# Patient Record
Sex: Male | Born: 1975 | Race: White | Hispanic: No | Marital: Single | Smoking: Former smoker
Health system: Southern US, Community
[De-identification: ages and names within clinical notes are randomized; demographics above are authoritative.]

## PROBLEM LIST (undated history)

## (undated) DIAGNOSIS — M1990 Unspecified osteoarthritis, unspecified site: Secondary | ICD-10-CM

## (undated) DIAGNOSIS — R059 Cough, unspecified: Secondary | ICD-10-CM

## (undated) DIAGNOSIS — K069 Disorder of gingiva and edentulous alveolar ridge, unspecified: Secondary | ICD-10-CM

## (undated) DIAGNOSIS — Z972 Presence of dental prosthetic device (complete) (partial): Secondary | ICD-10-CM

## (undated) DIAGNOSIS — R55 Syncope and collapse: Secondary | ICD-10-CM

## (undated) DIAGNOSIS — R05 Cough: Secondary | ICD-10-CM

## (undated) DIAGNOSIS — H6690 Otitis media, unspecified, unspecified ear: Secondary | ICD-10-CM

## (undated) HISTORY — DX: Disorder of gingiva and edentulous alveolar ridge, unspecified: K06.9

## (undated) HISTORY — DX: Unspecified osteoarthritis, unspecified site: M19.90

## (undated) HISTORY — DX: Cough, unspecified: R05.9

## (undated) HISTORY — DX: Syncope and collapse: R55

## (undated) HISTORY — DX: Cough: R05

## (undated) HISTORY — DX: Otitis media, unspecified, unspecified ear: H66.90

## (undated) HISTORY — PX: MULTIPLE TOOTH EXTRACTIONS: SHX2053

## (undated) HISTORY — PX: INNER EAR SURGERY: SHX679

## (undated) HISTORY — DX: Presence of dental prosthetic device (complete) (partial): Z97.2

---

## 2009-08-05 ENCOUNTER — Ambulatory Visit (HOSPITAL_COMMUNITY): Admission: RE | Admit: 2009-08-05 | Discharge: 2009-08-05 | Payer: Self-pay | Admitting: Internal Medicine

## 2009-08-05 ENCOUNTER — Ambulatory Visit: Payer: Self-pay | Admitting: Internal Medicine

## 2009-08-05 DIAGNOSIS — R05 Cough: Secondary | ICD-10-CM

## 2009-08-05 DIAGNOSIS — K4090 Unilateral inguinal hernia, without obstruction or gangrene, not specified as recurrent: Secondary | ICD-10-CM | POA: Insufficient documentation

## 2009-08-05 DIAGNOSIS — F341 Dysthymic disorder: Secondary | ICD-10-CM

## 2009-08-05 DIAGNOSIS — K029 Dental caries, unspecified: Secondary | ICD-10-CM | POA: Insufficient documentation

## 2009-09-05 ENCOUNTER — Ambulatory Visit: Payer: Self-pay | Admitting: Infectious Diseases

## 2009-10-17 ENCOUNTER — Ambulatory Visit: Payer: Self-pay | Admitting: Internal Medicine

## 2010-02-15 ENCOUNTER — Ambulatory Visit: Payer: Self-pay | Admitting: Internal Medicine
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
## 2011-02-20 ENCOUNTER — Encounter: Payer: Self-pay | Admitting: Internal Medicine

## 2011-05-23 ENCOUNTER — Other Ambulatory Visit: Payer: Self-pay | Admitting: *Deleted

## 2011-05-23 NOTE — Telephone Encounter (Signed)
Pt is making an appt today

## 2011-05-24 MED ORDER — AMITRIPTYLINE HCL 25 MG PO TABS: 25.0000 mg | ORAL_TABLET | Freq: Every day | ORAL | Status: DC

## 2011-07-23 ENCOUNTER — Ambulatory Visit (INDEPENDENT_AMBULATORY_CARE_PROVIDER_SITE_OTHER): Payer: Self-pay | Admitting: Internal Medicine

## 2011-07-23 DIAGNOSIS — R059 Cough, unspecified: Secondary | ICD-10-CM

## 2011-07-23 DIAGNOSIS — K4090 Unilateral inguinal hernia, without obstruction or gangrene, not specified as recurrent: Secondary | ICD-10-CM

## 2011-07-23 DIAGNOSIS — R05 Cough: Secondary | ICD-10-CM

## 2011-07-23 DIAGNOSIS — F341 Dysthymic disorder: Secondary | ICD-10-CM

## 2011-07-23 MED ORDER — AMITRIPTYLINE HCL 25 MG PO TABS: 25.0000 mg | ORAL_TABLET | Freq: Every day | ORAL | Status: AC

## 2011-07-23 NOTE — Patient Instructions (Signed)
Inguinal Hernia, Adult Muscles help keep everything in the body in its proper place. But if a weak spot in the muscles develops, something can poke through. That is called a hernia. When this happens in the lower part of the belly (abdomen), it is called an inguinal hernia. (It takes its name from a part of the body in this region called the inguinal canal.) A weak spot in the wall of muscles lets some fat or part of the small intestine bulge through. An inguinal hernia can develop at any age. Men get them more often than women. CAUSES In adults, an inguinal hernia develops over time.  It can be triggered by:   Suddenly straining the muscles of the lower abdomen.   Lifting heavy objects.   Straining to have a bowel movement. Difficult bowel movements (constipation) can lead to this.   Constant coughing. This may be caused by smoking or lung disease.   Being overweight.   Being pregnant.   Working at a job that requires long periods of standing or heavy lifting.   Having had an inguinal hernia before.  One type can be an emergency situation. It is called a strangulated inguinal hernia. It develops if part of the small intestine slips through the weak spot and cannot get back into the abdomen. The blood supply can be cut off. If that happens, part of the intestine may die. This situation requires emergency surgery. SYMPTOMS Often, a small inguinal hernia has no symptoms. It is found when a healthcare provider does a physical exam. Larger hernias usually have symptoms.   In adults, symptoms may include:   A lump in the groin. This is easier to see when the person is standing. It might disappear when lying down.   In men, a lump in the scrotum.   Pain or burning in the groin. This occurs especially when lifting, straining or coughing.   A dull ache or feeling of pressure in the groin.   Signs of a strangulated hernia can include:   A bulge in the groin that becomes very painful and  tender to the touch.   A bulge that turns red or purple.   Fever, nausea and vomiting.   Inability to have a bowel movement or to pass gas.  DIAGNOSIS To decide if you have an inguinal hernia, a healthcare provider will probably do a physical examination.  This will include asking questions about any symptoms you have noticed.   The healthcare provider might feel the groin area and ask you to cough. If an inguinal hernia is felt, the healthcare provider may try to slide it back into the abdomen.   Usually no other tests are needed.  TREATMENT Treatments can vary. The size of the hernia makes a difference. Options include:  Watchful waiting. This is often suggested if the hernia is small and you have had no symptoms.   No medical procedure will be done unless symptoms develop.   You will need to watch closely for symptoms. If any occur, contact your healthcare provider right away.   Surgery. This is used if the hernia is larger or you have symptoms.   Open surgery. This is usually an outpatient procedure (you will not stay overnight in a hospital). An cut (incision) is made through the skin in the groin. The hernia is put back inside the abdomen. The weak area in the muscles is then repaired by:  --Herniorrhaphy. In this type of surgery, the weak muscles are sewn   back together. --Hernioplasty. A patch or mesh is used to close the weak area in the abdominal wall.   Laparoscopy. In this procedure, a surgeon makes small incisions. A thin tube with a tiny video camera (called a laparoscope) is put into the abdomen. The surgeon repairs the hernia with mesh by looking with the video camera and using two long instruments.  HOME CARE INSTRUCTIONS  After surgery to repair an inguinal hernia:   You will need to take pain medicine prescribed by your healthcare provider. Follow all directions carefully.   You will need to take care of the wound from the incision.   Your activity will be  restricted for awhile. This will probably include no heavy lifting for several weeks. You also should not do anything too active for a few weeks. When you can return to work will depend on the type of job that you have.   During "watchful waiting" periods, you should:   Maintain a healthy weight.   Eat a diet high in fiber (fruits, vegetables and whole grains).   Drink plenty of fluids to avoid constipation. This means drinking enough water and other liquids to keep your urine clear or pale yellow.   Do not lift heavy objects.   Do not stand for long periods of time.   Quit smoking. This should keep you from developing a frequent cough.  SEEK MEDICAL CARE IF:  A bulge develops in your groin area.   You feel pain, a burning sensation or pressure in the groin. This might be worse if you are lifting or straining.   You develop a fever of more than 100.5F (38.1 C).  SEEK IMMEDIATE MEDICAL CARE IF:  Pain in the groin increases suddenly.   A bulge in the groin gets bigger suddenly and does not go down.   For men, there is sudden pain in the scrotum. Or, the size of the scrotum increases.   A bulge in the groin area becomes red or purple and is painful to touch.   You have nausea or vomiting that does not go away.   You feel your heart beating much faster than normal.   You cannot have a bowel movement or pass gas.   You develop a fever of more than 102.0F (38.9C).  Document Released: 04/28/2009 Document Re-Released: 05/30/2010 [employer] Patient Information 2011 [employer]. 

## 2011-07-23 NOTE — Assessment & Plan Note (Signed)
The swelling in the right groin area has been getting bigger with more frequent pain episodes.. I will have referred him to Gen. surgery today. Patient was given an appointment and was asked to come to the ER if has acute pain which is unresolved with rest.

## 2011-07-23 NOTE — Progress Notes (Signed)
  Subjective:    Patient ID: Austin [name], male    DOB: Sep 16, 1976, 35 y.o.   MRN: [id number]  HPI [name] back as 35 year old man with past medical history of dysthymia, dental case, chronic cough and right inguinal hernia..  Patient is doing well in terms of his depression. He is compliant with his amitriptyline. No suicidal or homicidal ideations.  Patient's dental caries were taken care off about 5 months ago where he had multiple teeth removed.  The patient has a right inguinal hernia which has progressively been getting bigger over the last few years. Patient says that he has pain in his groin area once in a while. Denies any pain today. Patient says pain is 7-8/10 exacerbated by straining and coughing and relieved by rest. Described as aching. Patient says the frequency of pain has been increasing.  Patient is to have her tetanus shot but he refuses it today.   Review of Systems  Constitutional: Negative for fever, activity change and appetite change.  HENT: Negative for sore throat.   Respiratory: Negative for cough and shortness of breath.   Cardiovascular: Negative for chest pain and leg swelling.  Gastrointestinal: Negative for nausea, abdominal pain, diarrhea, constipation and abdominal distention.  Genitourinary: Positive for scrotal swelling and testicular pain. Negative for frequency, hematuria and difficulty urinating.  Neurological: Negative for dizziness and headaches.  Psychiatric/Behavioral: Negative for suicidal ideas and behavioral problems.       Objective:   Physical Exam  Constitutional: He is oriented to person, place, and time. He appears well-developed and well-nourished.  HENT:  Head: Normocephalic and atraumatic.  Eyes: Conjunctivae and EOM are normal. Pupils are equal, round, and reactive to light. No scleral icterus.  Neck: Normal range of motion. Neck supple. No JVD present. No thyromegaly present.  Cardiovascular: Normal rate, regular rhythm, normal  heart sounds and intact distal pulses.  Exam reveals no gallop and no friction rub.   No murmur heard. Pulmonary/Chest: Effort normal and breath sounds normal. No respiratory distress. He has no wheezes. He has no rales.  Abdominal: Soft. Bowel sounds are normal. He exhibits no distension and no mass. There is tenderness. There is no rebound and no guarding. A hernia is present. Hernia confirmed positive in the right inguinal area.    Musculoskeletal: Normal range of motion. He exhibits no edema and no tenderness.  Lymphadenopathy:    He has no cervical adenopathy.  Neurological: He is alert and oriented to person, place, and time.  Psychiatric: He has a normal mood and affect. His behavior is normal.          Assessment & Plan:   No problem-specific assessment & plan notes found for this encounter.

## 2011-07-23 NOTE — Assessment & Plan Note (Signed)
He can given prescription for 12 months.

## 2011-07-23 NOTE — Assessment & Plan Note (Signed)
Patient has inhaler for relief of cough. Given the description of the cough that it is exacerbated by exercise it seems that patient has cough variant of asthma.

## 2011-08-03 ENCOUNTER — Encounter (INDEPENDENT_AMBULATORY_CARE_PROVIDER_SITE_OTHER): Payer: Self-pay | Admitting: General Surgery

## 2011-08-15 ENCOUNTER — Encounter (INDEPENDENT_AMBULATORY_CARE_PROVIDER_SITE_OTHER): Payer: Self-pay | Admitting: General Surgery

## 2011-08-15 ENCOUNTER — Ambulatory Visit (INDEPENDENT_AMBULATORY_CARE_PROVIDER_SITE_OTHER): Payer: PRIVATE HEALTH INSURANCE | Admitting: General Surgery

## 2011-08-15 VITALS — BP 132/94 | HR 64 | Temp 97.0°F | Ht 73.0 in | Wt 177.4 lb

## 2011-08-15 DIAGNOSIS — K4090 Unilateral inguinal hernia, without obstruction or gangrene, not specified as recurrent: Secondary | ICD-10-CM

## 2011-08-15 NOTE — Patient Instructions (Signed)
Hernia, Repair with Laparoscope A hernia occurs when an internal organ pushes out through a weak spot in the belly (abdominal) wall muscles. Hernias most commonly occur in the groin and around the navel. Hernias can also occur through a cut by the surgeon (incision) after an abdominal operation. A hernia may be caused by:  Lifting heavy objects.   Prolonged coughing.   Straining to move your bowels.  Hernias can often be pushed back into place (reduced). Most hernias tend to get worse over time. Problems occur when abdominal contents get stuck in the opening and the blood supply is blocked or impaired (incarcerated hernia). Because of these risks, you require surgery to repair the hernia. Your hernia will be repaired using a laparoscope. Laparoscopic surgery is a type of minimally invasive surgery. It does not involve making a typical surgical cut (incision) in the skin. A laparoscope is a telescope-like rod and lens system. It is usually connected to a video camera and a light source so your caregiver can clearly see the operative area. The instruments are inserted through  to  inch (5 mm or 10 mm) openings in the skin at specific locations. A working and viewing space is created by blowing a small amount of carbon dioxide gas into the abdominal cavity. The abdomen is essentially blown up like a balloon (insufflated). This elevates the abdominal wall above the internal organs like a dome. The carbon dioxide gas is common to the human body and can be absorbed by tissue and removed by the respiratory system. Once the repair is completed, the small incisions will be closed with either stitches (sutures) or staples (just like a paper stapler only this staple holds the skin together). BEFORE THE PROCEDURE Laparoscopy can be done either in a hospital or out-patient clinic. You may be given a mild sedative to help you relax before the procedure. Once in the operating room, you will be given a general  anesthesia to make you sleep (unless you and your caregiver choose a different anesthetic).  LET YOUR CAREGIVERS KNOW ABOUT THE FOLLOWING:  Allergies.  Medications taken including herbs, eye drops, over the counter medications, and creams.   Use of steroids (by mouth or creams).   Previous problems with anesthetics or Novocaine.   Possibility of pregnancy, if this applies.  History of blood clots (thrombophlebitis).   History of bleeding or blood problems.   Previous surgery.   Other health problems.   AFTER THE PROCEDURE After the procedure you will be watched in a recovery area. Depending on what type of hernia was repaired, you might be admitted to the hospital or you might go home the same day. With this procedure you may have less pain and scarring. This usually results in a quicker recovery and less risk of infection. HOME CARE INSTRUCTIONS  Bed rest is not required. You may continue your normal activities but avoid heavy lifting (more than 10 pounds) or straining.   Cough gently. If you are a smoker it is best to stop, as even the best hernia repair can break down with the continual strain of coughing.   Avoid driving until given the OK by your surgeon.   There are no dietary restrictions unless given otherwise.   TAKE ALL MEDICATIONS AS DIRECTED.   Only take over-the-counter or prescription medicines for pain, discomfort, or fever as directed by your caregiver.  SEEK MEDICAL CARE IF:  There is increasing abdominal pain or pain in your incisions.   There is more  bleeding from incisions, other than minimal spotting.   You feel light headed or faint.   You develop an unexplained temperature, chills, and/or an oral temperature above 101.   You have redness, swelling, or increasing pain in the wound.   Pus coming from wound.   A foul smell coming from the wound or dressings.  SEEK IMMEDIATE MEDICAL CARE IF:  You develop a rash.   You have difficulty breathing.     You have any allergic problems.  MAKE SURE YOU:   Understand these instructions.   Will watch your condition.   Will get help right away if you are not doing well or get worse.  Document Released: 12/10/2005 Document Re-Released: 11/28/2009 [facility] Patient Information 2011 [place], [place].
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
## 2011-08-16 ENCOUNTER — Encounter (INDEPENDENT_AMBULATORY_CARE_PROVIDER_SITE_OTHER): Payer: Self-pay | Admitting: General Surgery

## 2011-08-20 ENCOUNTER — Other Ambulatory Visit (INDEPENDENT_AMBULATORY_CARE_PROVIDER_SITE_OTHER): Payer: Self-pay | Admitting: General Surgery

## 2011-08-20 ENCOUNTER — Encounter (HOSPITAL_COMMUNITY): Payer: Self-pay

## 2011-08-20 HISTORY — PX: INGUINAL HERNIA REPAIR: SUR1180

## 2011-08-20 LAB — URINALYSIS, ROUTINE W REFLEX MICROSCOPIC
Bilirubin Urine: NEGATIVE
Ketones, ur: NEGATIVE mg/dL
Nitrite: NEGATIVE
pH: 6.5 (ref 5.0–8.0)

## 2011-08-20 LAB — CBC
MCH: 30.1 pg (ref 26.0–34.0)
MCV: 86 fL (ref 78.0–100.0)
Platelets: 225 10*3/uL (ref 150–400)
RBC: 5.28 MIL/uL (ref 4.22–5.81)

## 2011-08-20 LAB — DIFFERENTIAL
Eosinophils Absolute: 0.2 10*3/uL (ref 0.0–0.7)
Eosinophils Relative: 3 % (ref 0–5)
Lymphs Abs: 2.4 10*3/uL (ref 0.7–4.0)
Monocytes Absolute: 0.7 10*3/uL (ref 0.1–1.0)
Monocytes Relative: 11 % (ref 3–12)
Neutrophils Relative %: 47 % (ref 43–77)

## 2011-08-20 LAB — BASIC METABOLIC PANEL
BUN: 14 mg/dL (ref 6–23)
CO2: 31 mEq/L (ref 19–32)
Chloride: 100 mEq/L (ref 96–112)
Creatinine, Ser: 0.92 mg/dL (ref 0.50–1.35)
GFR calc Af Amer: >60 mL/min (ref >60)

## 2011-08-22 ENCOUNTER — Ambulatory Visit (HOSPITAL_COMMUNITY)
Admission: RE | Admit: 2011-08-22 | Discharge: 2011-08-23 | Disposition: A | Discharge status: Home / Self Care | Payer: Self-pay | Source: Ambulatory Visit | Attending: General Surgery | Admitting: General Surgery

## 2011-08-22 DIAGNOSIS — Z79899 Other long term (current) drug therapy: Secondary | ICD-10-CM | POA: Insufficient documentation

## 2011-08-22 DIAGNOSIS — K4090 Unilateral inguinal hernia, without obstruction or gangrene, not specified as recurrent: Secondary | ICD-10-CM

## 2011-08-22 DIAGNOSIS — R339 Retention of urine, unspecified: Secondary | ICD-10-CM | POA: Insufficient documentation

## 2011-08-22 DIAGNOSIS — Z01812 Encounter for preprocedural laboratory examination: Secondary | ICD-10-CM | POA: Insufficient documentation

## 2011-08-22 DIAGNOSIS — F3289 Other specified depressive episodes: Secondary | ICD-10-CM | POA: Insufficient documentation

## 2011-08-22 DIAGNOSIS — J45909 Unspecified asthma, uncomplicated: Secondary | ICD-10-CM | POA: Insufficient documentation

## 2011-08-22 DIAGNOSIS — F329 Major depressive disorder, single episode, unspecified: Secondary | ICD-10-CM | POA: Insufficient documentation

## 2011-08-30 NOTE — Op Note (Signed)
NAMEZHION, PEVEHOUSE               ACCOUNT NO.:  [id number]  MEDICAL RECORD NO.:  [id number]  LOCATION:  DAYL                         FACILITY:  [facility]  PHYSICIAN:  Mary Sella. Andrey Campanile, MD     DATE OF BIRTH:  02-Apr-1976  DATE OF PROCEDURE:  08/22/2011 DATE OF DISCHARGE:                              OPERATIVE REPORT   PREOPERATIVE DIAGNOSIS:  Right inguinal hernia.  POSTOPERATIVE DIAGNOSIS:  Large right indirect inguinal hernia.  PROCEDURE:  Laparoscopic converted to open repair of right indirect inguinal hernia with mesh.  SURGEON:  Atilano Ina, MD  ASSISTANT SURGEON:  None.  ANESTHESIA:  General, plus 30 mL of Exparel.  FINDINGS:  The patient had a very large indirect hernia sac.  There was no evidence of a direct hernia.  The hernia was quite stuck when I attempted to take down the peritoneal flap laparoscopically.  Therefore, I elected to convert to an open procedure.  The peritoneal flap was closed with Ethicon secure strap.  We repaired the hernia with a 3-inch x 6-inch piece of Ultrapro mesh.  ESTIMATED BLOOD LOSS:  Minimal.  INDICATIONS FOR PROCEDURE:  The patient is a very pleasant 35 year old gentleman who has at least a 7-year history of a right groin bulge.  It only recently started to bother him.  We discussed the risks and benefits of repair including, but not limited to, bleeding, infection, injury to surrounding structures, chronic groin pain, urinary retention, DVT occurrence, hernia recurrence, mesh complications, hematoma formation, seroma formation, and testicular loss.  He elected to proceed with surgery.  DESCRIPTION OF PROCEDURE:  The patient's right groin was marked in the holding area with the patient confirming the operative site.  He was then taken back to the operating room where general endotracheal anesthesia was established.  A Foley catheter was placed.  Sequential compression devices were placed.  His abdomen and groin were prepped  and draped in the usual standard surgical fashion.  He received antibiotics prior to the skin incision.  Surgical time-out was performed.  His abdomen was prepped and draped in the usual standard surgical fashion. Local was infiltrated in the base of his umbilicus.  Next, a 1.5 cm vertical infraumbilical incision was made with a #11 blade.  The fascia was grasped anteriorly.  Next, the fascia was incised and the abdominal cavity was entered.  A pursestring suture was placed around the fascial incision consisting of 0 Vicryl.  A 12 mm Hasson trocar was placed directly into the abdominal cavity. Pneumoperitoneum was then established up to a maximum pressure of 15 mmHg.  Laparoscope was advanced; the abdominal cavity was surveilled. There was only evidence of a right indirect inguinal hernia as it was lateral to the inferior epigastric vessels.  There was no evidence of a contralateral hernia.  There was no evidence of a right direct hernia. I then incised the peritoneum starting several inches above and lateral to the anterior superior iliac spine on the right and extending the incision medial to the median umbilical ligament.  This was done with Endo Shears with electrocautery.  I then tried to dissect the peritoneal flap down from the abdominal wall.  The inferior epigastric  vessels were identified and preserved.  I spent probably about 20 minutes to 30 minutes in trying to dissect the peritoneal flap away from the abdominal wall and it was just quite stuck.  I was not really making any progress whatsoever in identifying the pubic bone or getting the flap down near the internal ring.  I therefore elected to switch to an open procedure. Prior to doing that, I did reapproximate the peritoneal flap with Ethicon secure strap.  I removed the Hasson trocar and tied down the previously placed pursestring suture thus obliterating the fascial defect in the umbilicus.  This was viewed  laparoscopically.  I then made a 7-cm incision in his right groin starting 1 cm above the pubic tubercle extending it laterally toward the anterior superior iliac spine with a #10 blade.  The subcu tissue was divided with electrocautery.  Scarpa's fascia was divided.  There was a large hernia sac and hernia that was coming up through the external oblique aponeurosis and through the ring.  I incised the external oblique aponeurosis with a #15 blade and enlarged the opening with a pair of Metzenbaum scissors.  Hemostats were placed on each edge of the external oblique aponeurosis and a Gelpi retractor was placed.  I then it lifted up, was able to get my finger around the hernia and the cord structures and placed a Penrose drain around it.  The hernia sac was essentially the size of a large soft ball.  I identified the spermatic cord and the testicular vessels.  Hernia sac was actually quite long and we ended up pulling the testicle up into the field.  The cremasteric fibers were divided with electrocautery as well as in a blunt fashion.  After about 30 minutes, I was able to separate the hernia sac in its entirety from the surrounding cord structures.  The Penrose drain was repositioned to isolate the cord structures.  I was able to reduce and isolate the sac all the way down to the deep ring. The sac easily reduced back into abdominal cavity.  At this point, I obtained a piece of Ethicon Ultrapro 3-inch x 6-inch mesh, trimmed it slightly at the apex and made a slit to accommodate the cord structures. The mesh was sutured to the pubic tubercle with 2-0 Prolene and the inferior lateral edge of the mesh was then sutured in a running fashion to the shelving edge of the inguinal ligament.  The superomedial aspect of the mesh was then sutured in an interrupted fashion to the conjoint tendon and to the internal oblique with a 2-0 Prolene.  The tails were tucked underneath the external oblique  aponeurosis.  I then reapproximated the tails with interrupted 2-0 Prolene.  The cord structure was not too tight.  I returned the testicle to the scrotum ensuring that the spermatic cord was not twisted.  I irrigated the wound.  I then injected Exparel in the conjoint tendon and the Scarpa's fascia.  We reapproximated the external oblique aponeurosis with a running 2-0 Vicryl and then Scarpa's fascia with inverted interrupted 3-0 Vicryl, and the skin with 4-0 Monocryl in a subcuticular fashion.  I closed the other three trocar sites, skin with a 4-0 Monocryl in a subcuticular fashion followed by application of Dermabond.  The Foley catheter was removed.  The patient was extubated and taken to the recovery room in stable condition.  All needle, instrument, and sponge counts were correct x2.  There were no immediate complications.  The patient tolerated  the procedure well.     Mary Sella. Andrey Campanile, MD     EMW/MEDQ  D:  08/22/2011  T:  08/22/2011  Job:  [id number]  cc:   Lars Mage, MD  Electronically Signed by Gaynelle Adu M.D. on 08/30/2011 02:18:47 PM

## 2011-09-19 ENCOUNTER — Encounter (INDEPENDENT_AMBULATORY_CARE_PROVIDER_SITE_OTHER): Payer: Self-pay | Admitting: General Surgery

## 2011-09-19 ENCOUNTER — Ambulatory Visit (INDEPENDENT_AMBULATORY_CARE_PROVIDER_SITE_OTHER): Payer: Self-pay | Admitting: General Surgery

## 2011-09-19 VITALS — BP 126/80 | HR 60 | Ht 73.0 in | Wt 175.0 lb

## 2011-09-19 DIAGNOSIS — Z09 Encounter for follow-up examination after completed treatment for conditions other than malignant neoplasm: Secondary | ICD-10-CM

## 2011-09-19 NOTE — Patient Instructions (Signed)
No heavy lifting, including pushing, pulling, or lifting anything greater than 20 pounds for another 2 weeks

## 2011-09-19 NOTE — Progress Notes (Signed)
Chief complaint: Postop  Procedure: Status post laparoscopic converted to open repair of right indirect inguinal hernia with mesh August 29  History of Present Ilness: 35 year old male comes in today for his first postoperative visit. He states that he was numb in his right groin for 2 weeks after surgery. He denies any fevers or chills. He denies any nausea or vomiting. He denies any dysuria. He reports a good appetite. He states that the numbness is essentially resolved. He still takes ibuprofen on a frequent basis. He takes one pain pill at night.  Physical Exam: BP 126/80  Pulse 60  Ht 6\' 1"  (1.854 m)  Wt 175 lb (79.379 kg)  BMI 23.09 kg/m2  Well-developed well-nourished Caucasian male in no apparent distress Pulmonary-lungs clear to auscultation Cardiac a regular rate and rhythm Abdomen-soft, nontender, nondistended. Well-healed trocar incisions GU-well-healed right inguinal incision. No hematoma or seroma. No cellulitis or induration. Both testicles are descended  Data reviewed: I reviewed my operative note  Assessment and Plan: Status post laparoscopic converted to open repair of right indirect inguinal hernia with mesh  Overall he is doing well. He had a very large inguinal hernia that I was unable to reduce laparoscopically; therefore, I had to convert to an open procedure.  I gave him a note allowing him to return to work next Friday with restrictions.  I will see him in 6 weeks. I instructed him not to lift anything heavy for another 2 weeks.

## 2011-11-07 ENCOUNTER — Encounter (INDEPENDENT_AMBULATORY_CARE_PROVIDER_SITE_OTHER): Payer: Self-pay | Admitting: General Surgery

## 2011-12-07 ENCOUNTER — Encounter (INDEPENDENT_AMBULATORY_CARE_PROVIDER_SITE_OTHER): Payer: Self-pay | Admitting: General Surgery

## 2011-12-07 ENCOUNTER — Ambulatory Visit (INDEPENDENT_AMBULATORY_CARE_PROVIDER_SITE_OTHER): Payer: PRIVATE HEALTH INSURANCE | Admitting: General Surgery

## 2011-12-07 VITALS — BP 134/98 | HR 60 | Temp 97.8°F | Resp 18 | Ht 73.0 in | Wt 182.5 lb

## 2011-12-07 DIAGNOSIS — Z09 Encounter for follow-up examination after completed treatment for conditions other than malignant neoplasm: Secondary | ICD-10-CM

## 2011-12-07 NOTE — Progress Notes (Signed)
Chief complaint: Postop  Procedure: Status post laparoscopic converted to open repair of right indirect inguinal hernia with mesh August 29  History of Present Ilness: 35 year old male comes in today for his second postoperative visit. He states that he has been doing well except for the flu bug several days ago.  He denies any nausea or vomiting. He denies any dysuria. He reports a good appetite. He states that the numbness has resolved. He no longer takes any pain meds. He denies any inguinal pain.  Physical Exam: BP 134/98  Pulse 60  Temp(Src) 97.8 F (36.6 C) (Temporal)  Resp 18  Ht 6\' 1"  (1.854 m)  Wt 182 lb 8 oz (82.781 kg)  BMI 24.08 kg/m2  Well-developed well-nourished Caucasian male in no apparent distress Pulmonary-lungs clear to auscultation Cardiac a regular rate and rhythm Abdomen-soft, nontender, nondistended. Well-healed trocar incisions GU-well-healed right inguinal incision. No hematoma or seroma. No cellulitis or induration. Both testicles are descended  Data reviewed: I reviewed my operative note and last office visit note  Assessment and Plan: Status post laparoscopic converted to open repair of right indirect inguinal hernia with mesh  Overall he is doing well. He had a very large inguinal hernia that I was unable to reduce laparoscopically; therefore, I had to convert to an open procedure.  F/u prn  Mary Sella. Andrey Campanile, MD, FACS General, Bariatric, & Minimally Invasive Surgery [facility], [place]

## 2011-12-07 NOTE — Patient Instructions (Signed)
Return as needed

## 2012-01-01 ENCOUNTER — Encounter: Payer: Self-pay | Admitting: *Deleted

## 2012-01-01 NOTE — Progress Notes (Signed)
Pt given appointment for Thursday. Will call if he has change in Sx before then.

## 2012-01-01 NOTE — Progress Notes (Signed)
Agree with plan 

## 2012-01-01 NOTE — Progress Notes (Signed)
Pt walked into clinic stating 2 1/2 weeks ago he got sick, mucus, cough, decrease energy, felt bad.  Took OTC meds and felt better. Now still has cough, small amount mucus. No other problems.  Hx: asthma  Do you want him to be seen or just wait it out. He is taking allergy pills.This helps the cough. Energy level back, appetite good. Denies SOB Normally does not get sick.  Please advise

## 2012-01-03 ENCOUNTER — Ambulatory Visit (INDEPENDENT_AMBULATORY_CARE_PROVIDER_SITE_OTHER): Payer: Self-pay | Admitting: Internal Medicine

## 2012-01-03 VITALS — BP 141/78 | HR 62 | Temp 97.0°F | Wt 181.3 lb

## 2012-01-03 DIAGNOSIS — Z23 Encounter for immunization: Secondary | ICD-10-CM

## 2012-01-03 DIAGNOSIS — R05 Cough: Secondary | ICD-10-CM

## 2012-01-03 DIAGNOSIS — Z299 Encounter for prophylactic measures, unspecified: Secondary | ICD-10-CM

## 2012-01-03 NOTE — Assessment & Plan Note (Signed)
Tetanus and influenza vaccination given today.

## 2012-01-03 NOTE — Assessment & Plan Note (Signed)
Patient was informed in the past that he may have asthma. No records of pulmonary function test. He has an albuterol inhaler which he does not use on a regular basis. Antihistamines over-the-counter seems to be controlling his cough. Chest x-ray obtained in August 2010 did not show any acute process. Therefore I will refer patient for pulmonary function test. Other differential diagnosis includes GERD. Patient reports about reflux symptoms and currently taking antacid which is controlling his symptoms. I recommend to continue current therapy and furthermore advised to reduce caffeine intake as much as possible.
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
## 2012-01-08 ENCOUNTER — Ambulatory Visit (HOSPITAL_COMMUNITY)
Admission: RE | Admit: 2012-01-08 | Discharge: 2012-01-08 | Disposition: A | Discharge status: Home / Self Care | Payer: Self-pay | Source: Ambulatory Visit | Attending: Internal Medicine | Admitting: Internal Medicine

## 2012-01-08 DIAGNOSIS — R059 Cough, unspecified: Secondary | ICD-10-CM

## 2012-01-08 DIAGNOSIS — R05 Cough: Secondary | ICD-10-CM | POA: Insufficient documentation

## 2012-04-21 ENCOUNTER — Ambulatory Visit (INDEPENDENT_AMBULATORY_CARE_PROVIDER_SITE_OTHER): Payer: Self-pay | Admitting: Internal Medicine

## 2012-04-21 ENCOUNTER — Encounter: Payer: Self-pay | Admitting: Internal Medicine

## 2012-04-21 VITALS — BP 131/81 | HR 80 | Temp 97.1°F | Wt 175.4 lb

## 2012-04-21 DIAGNOSIS — J45909 Unspecified asthma, uncomplicated: Secondary | ICD-10-CM

## 2012-04-21 MED ORDER — ALBUTEROL SULFATE HFA 108 (90 BASE) MCG/ACT IN AERS: 2.0000 | INHALATION_SPRAY | Freq: Four times a day (QID) | RESPIRATORY_TRACT | Status: AC | PRN

## 2012-04-21 MED ORDER — LORATADINE 10 MG PO TABS: 10.0000 mg | ORAL_TABLET | Freq: Every day | ORAL | Status: AC

## 2012-04-21 NOTE — Patient Instructions (Signed)
[email or web address].  Make sure you log in as a patient. Please, follow up with a referral to an ears-nose-throat specialist. Please continue to use albuterol prior to a strenuous physical activity.
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
## 2018-03-06 ENCOUNTER — Emergency Department (HOSPITAL_COMMUNITY): Payer: BLUE CROSS/BLUE SHIELD

## 2018-03-06 ENCOUNTER — Emergency Department (HOSPITAL_COMMUNITY)
Admission: EM | Admit: 2018-03-06 | Discharge: 2018-03-06 | Disposition: A | Discharge status: Home / Self Care | Payer: BLUE CROSS/BLUE SHIELD | Attending: Emergency Medicine | Admitting: Emergency Medicine

## 2018-03-06 ENCOUNTER — Encounter (HOSPITAL_COMMUNITY): Payer: Self-pay | Admitting: Emergency Medicine

## 2018-03-06 ENCOUNTER — Other Ambulatory Visit: Payer: Self-pay

## 2018-03-06 DIAGNOSIS — J45909 Unspecified asthma, uncomplicated: Secondary | ICD-10-CM | POA: Diagnosis not present

## 2018-03-06 DIAGNOSIS — E860 Dehydration: Secondary | ICD-10-CM

## 2018-03-06 DIAGNOSIS — Z79899 Other long term (current) drug therapy: Secondary | ICD-10-CM | POA: Diagnosis not present

## 2018-03-06 DIAGNOSIS — R42 Dizziness and giddiness: Secondary | ICD-10-CM | POA: Diagnosis present

## 2018-03-06 DIAGNOSIS — Z87891 Personal history of nicotine dependence: Secondary | ICD-10-CM | POA: Insufficient documentation

## 2018-03-06 LAB — CBC WITH DIFFERENTIAL/PLATELET
Basophils Absolute: 0 10*3/uL (ref 0.0–0.1)
Basophils Relative: 0 %
Eosinophils Absolute: 0.2 10*3/uL (ref 0.0–0.7)
Eosinophils Relative: 2 %
HEMATOCRIT: 46.6 % (ref 39.0–52.0)
HEMOGLOBIN: 15.6 g/dL (ref 13.0–17.0)
LYMPHS PCT: 33 %
Lymphs Abs: 2.5 10*3/uL (ref 0.7–4.0)
MCH: 29.8 pg (ref 26.0–34.0)
MCHC: 33.5 g/dL (ref 30.0–36.0)
MCV: 88.9 fL (ref 78.0–100.0)
Monocytes Absolute: 0.8 10*3/uL (ref 0.1–1.0)
Monocytes Relative: 10 %
NEUTROS ABS: 4.2 10*3/uL (ref 1.7–7.7)
NEUTROS PCT: 55 %
Platelets: 246 10*3/uL (ref 150–400)
RBC: 5.24 MIL/uL (ref 4.22–5.81)
RDW: 12.5 % (ref 11.5–15.5)
WBC: 7.6 10*3/uL (ref 4.0–10.5)

## 2018-03-06 LAB — URINALYSIS, ROUTINE W REFLEX MICROSCOPIC
Bilirubin Urine: NEGATIVE
GLUCOSE, UA: NEGATIVE mg/dL
Hgb urine dipstick: NEGATIVE
Ketones, ur: NEGATIVE mg/dL
LEUKOCYTES UA: NEGATIVE
NITRITE: NEGATIVE
Protein, ur: NEGATIVE mg/dL
Specific Gravity, Urine: 1.018 (ref 1.005–1.030)
pH: 6 (ref 5.0–8.0)

## 2018-03-06 LAB — COMPREHENSIVE METABOLIC PANEL
ALBUMIN: 4.8 g/dL (ref 3.5–5.0)
ALK PHOS: 79 U/L (ref 38–126)
ALT: 121 U/L — AB (ref 17–63)
AST: 52 U/L — AB (ref 15–41)
Anion gap: 10 (ref 5–15)
BILIRUBIN TOTAL: 2 mg/dL — AB (ref 0.3–1.2)
BUN: 21 mg/dL — AB (ref 6–20)
CALCIUM: 9.4 mg/dL (ref 8.9–10.3)
CO2: 26 mmol/L (ref 22–32)
CREATININE: 1.1 mg/dL (ref 0.61–1.24)
Chloride: 103 mmol/L (ref 101–111)
GFR calc Af Amer: >60 mL/min (ref >60)
GFR calc non Af Amer: >60 mL/min (ref >60)
GLUCOSE: 88 mg/dL (ref 65–99)
POTASSIUM: 3.7 mmol/L (ref 3.5–5.1)
Sodium: 139 mmol/L (ref 135–145)
Total Protein: 7.7 g/dL (ref 6.5–8.1)

## 2018-03-06 MED ORDER — SODIUM CHLORIDE 0.9 % IV BOLUS (SEPSIS)
1000.0000 mL | Freq: Once | INTRAVENOUS | Status: AC
  Administered 2018-03-06: 1000 mL via INTRAVENOUS
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
## 2018-03-06 NOTE — ED Triage Notes (Signed)
Pt c/o of dizziness, cough, and ear pain x5 days. Pt previous had positive testing for flu A and flu B

## 2018-03-06 NOTE — Discharge Instructions (Signed)
Drink plenty of fluids and follow-up with your family doctor next week for recheck to see how your blood pressure is doing and your dizziness

## 2018-03-06 NOTE — ED Notes (Signed)
Urine given to phlebotomist

## 2018-03-06 NOTE — ED Notes (Signed)
Pt ambulated back to room with steady gait

## 2018-07-05 IMAGING — CT CT HEAD W/O CM
3 series · 16 of 47 positions shown, 19 images · non-contrast
Comparison: None.

CLINICAL DATA: Dizziness, cough and ear pain.

EXAM:
CT HEAD WITHOUT CONTRAST
TECHNIQUE: Contiguous axial images were obtained from the base of the skull
through the vertex without intravenous contrast.

[Series 2: head wo · axial · 0.41mm/px · z∈[-66,+59]mm · 10 of 30 slices shown, 13 images]
[im 3/30  brain]
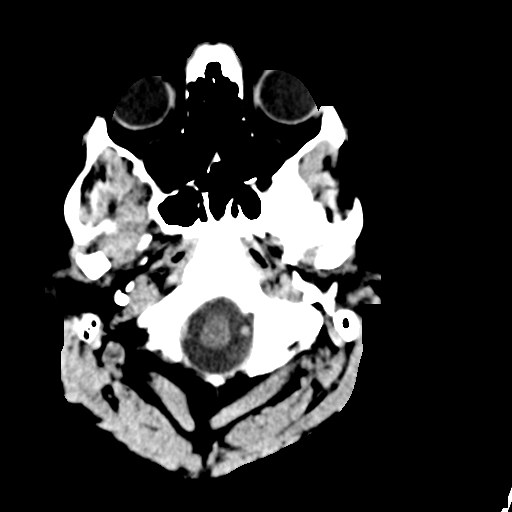
[im 3/30  bone]
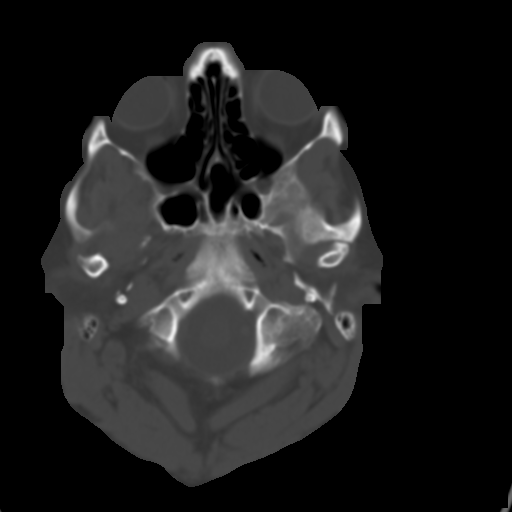
[im 6/30  brain]
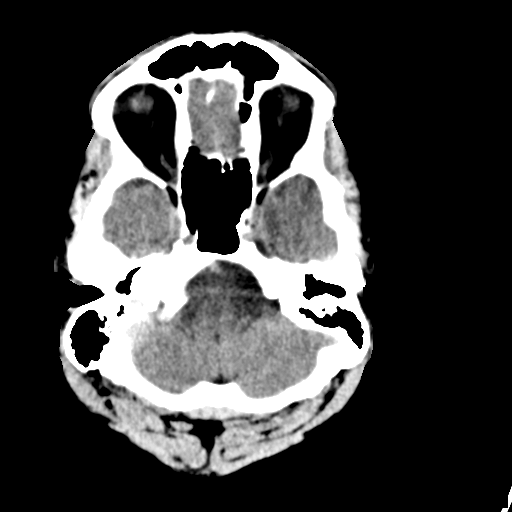
[im 9/30  brain]
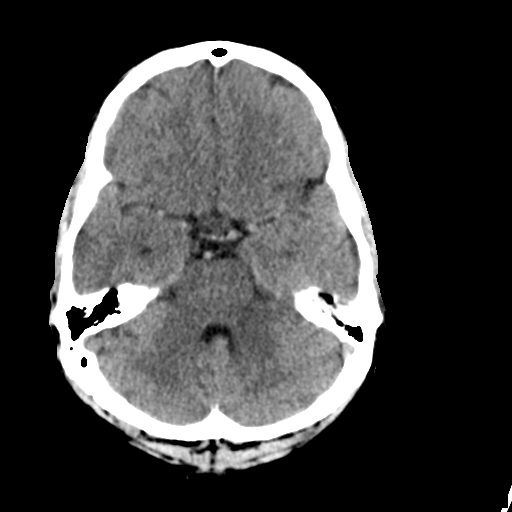
[im 11/30  brain]
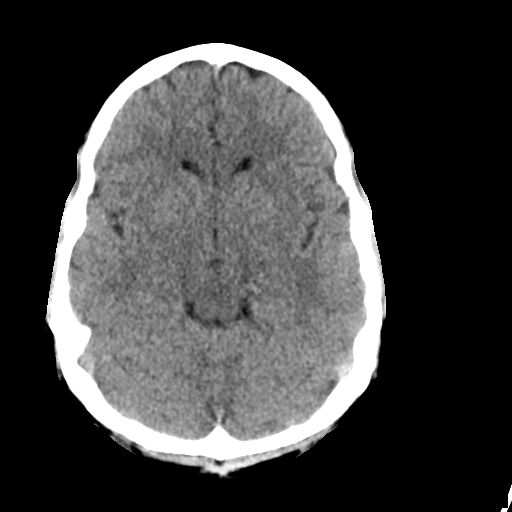
[im 14/30  brain]
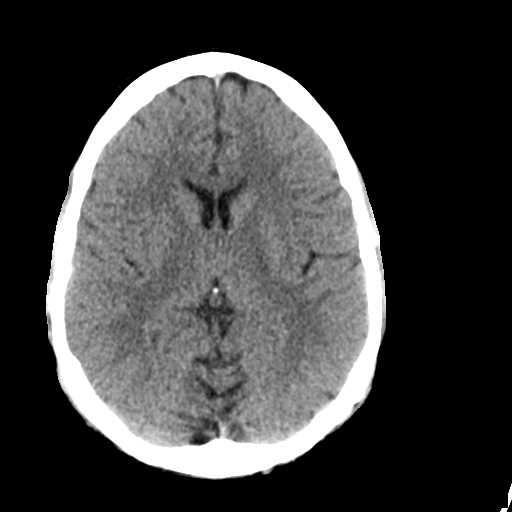
[im 14/30  bone]
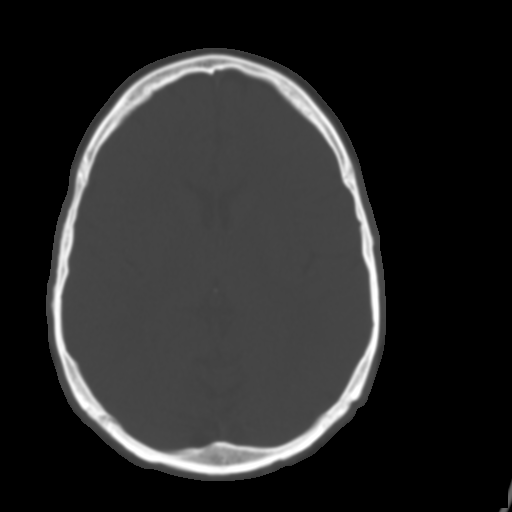
[im 17/30  brain]
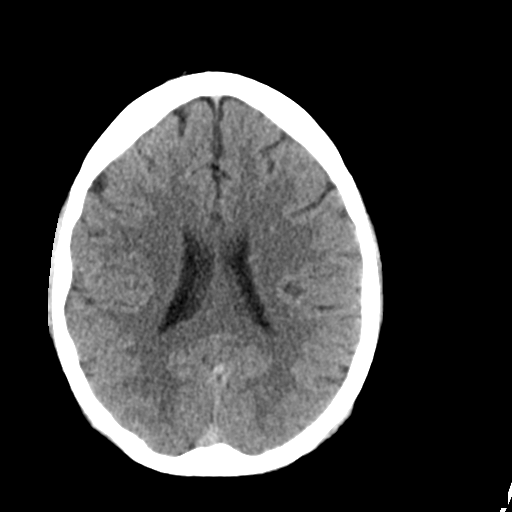
[im 20/30  brain]
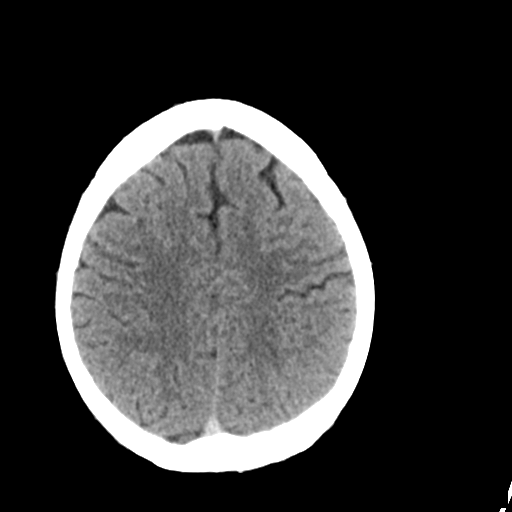
[im 23/30  brain]
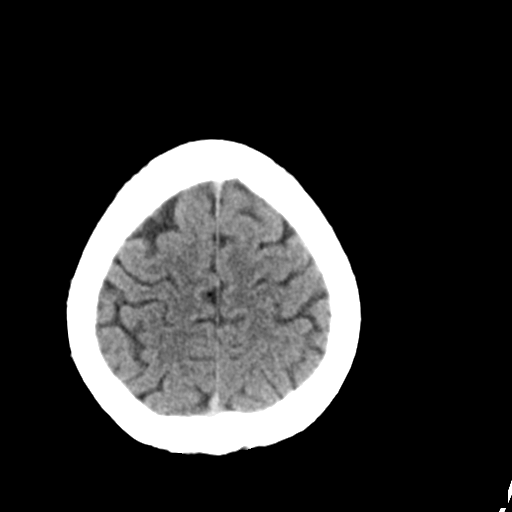
[im 25/30  brain]
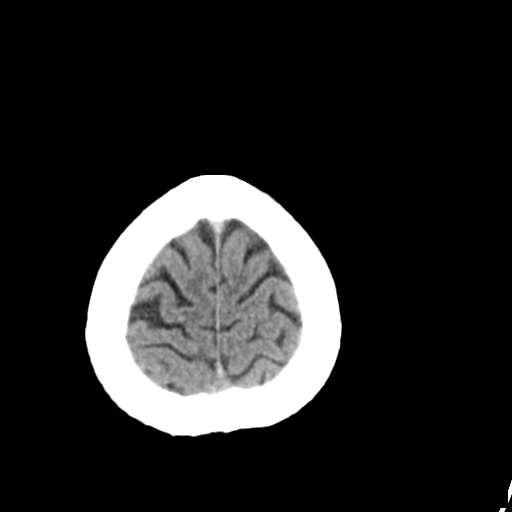
[im 25/30  bone]
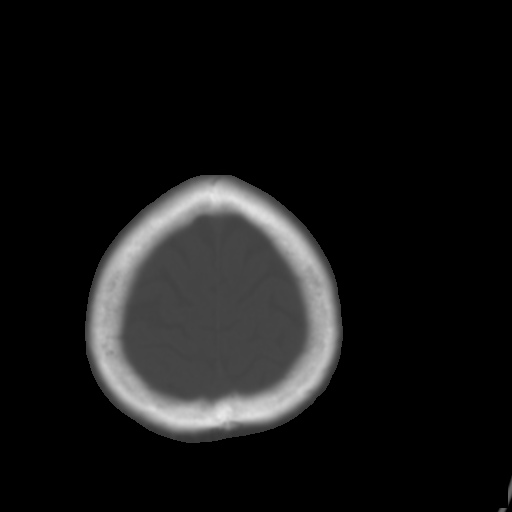
[im 28/30  brain]
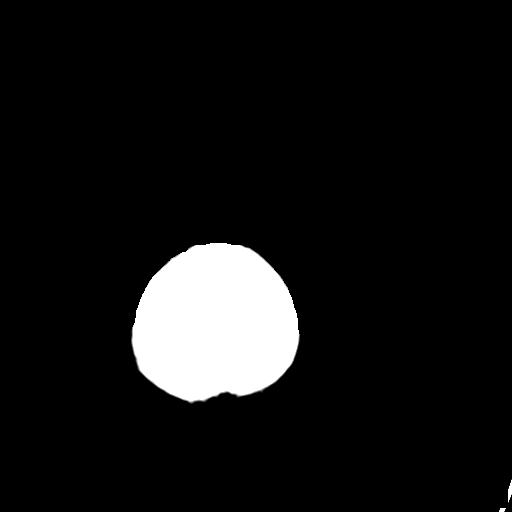

[Series 4: coronal soft tissue · coronal · 0.32mm/px · 3 of 65 slices shown]
[im 22/65  brain]
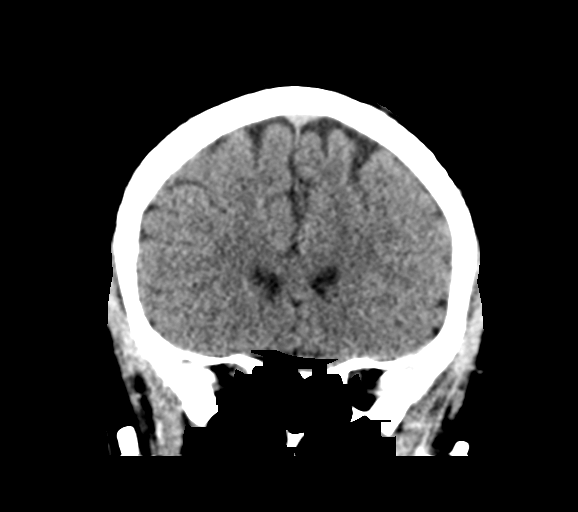
[im 29/65  brain]
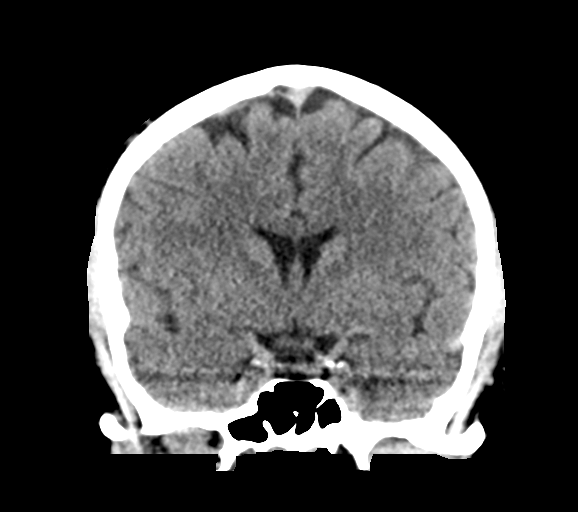
[im 36/65  brain]
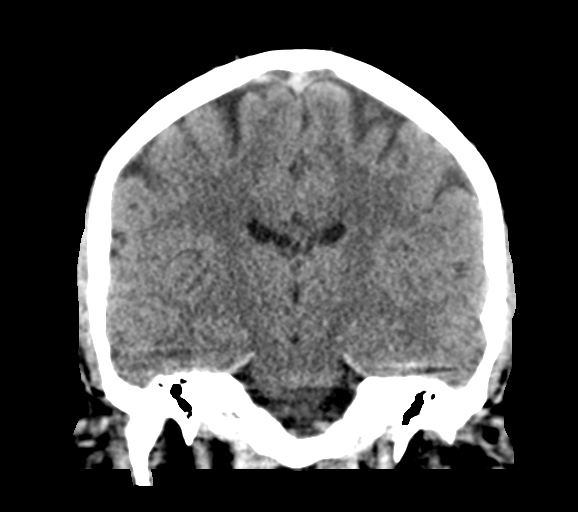

[Series 5: sagittal soft tissue · sagittal · 0.33mm/px · 3 of 56 slices shown]
[im 19/56  brain]
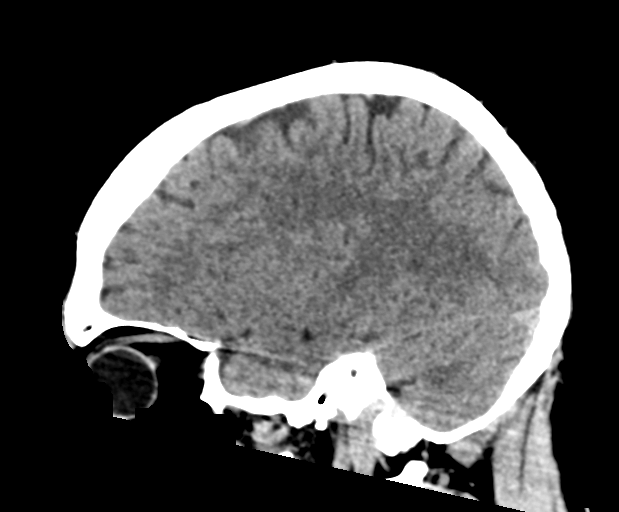
[im 28/56  brain]
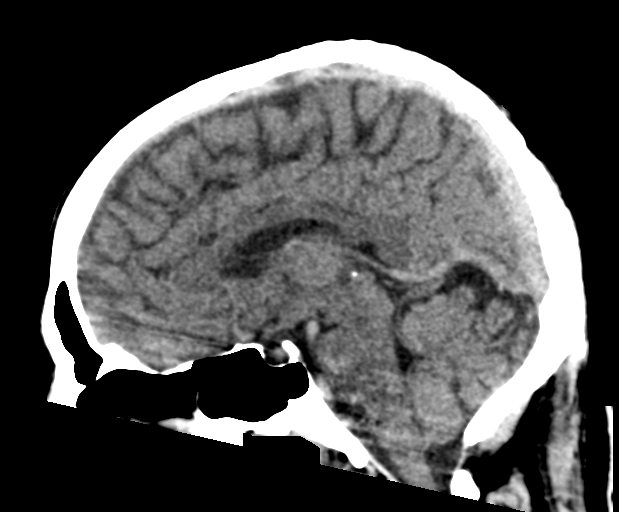
[im 37/56  brain]
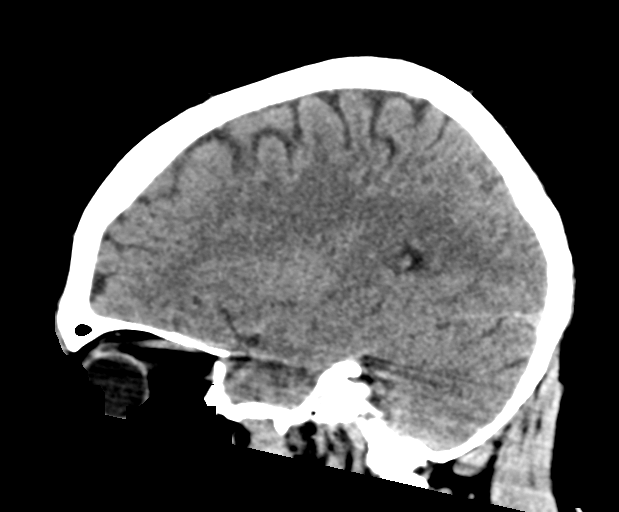

[16 of 47 positions shown; findings below may reference images not displayed]

FINDINGS: Brain: The ventricles are normal in size and configuration. No
extra-axial fluid collections are identified. The gray-white
differentiation is maintained. No CT findings for acute hemispheric
infarction or intracranial hemorrhage. No mass lesions. The
brainstem and cerebellum are normal.

Vascular: No hyperdense vessels or obvious aneurysm.

Skull: No acute skull fracture.  No bone lesion.

Sinuses/Orbits: The paranasal sinuses and mastoid air cells are
clear. The globes are intact.

Other: No scalp lesions, laceration or hematoma.
IMPRESSION: Normal head CT.

## 2024-03-09 DIAGNOSIS — Z1211 Encounter for screening for malignant neoplasm of colon: Secondary | ICD-10-CM | POA: Diagnosis not present

## 2024-03-09 DIAGNOSIS — R197 Diarrhea, unspecified: Secondary | ICD-10-CM | POA: Diagnosis not present

## 2024-04-20 DIAGNOSIS — Z1211 Encounter for screening for malignant neoplasm of colon: Secondary | ICD-10-CM | POA: Diagnosis not present

## 2024-12-12 DIAGNOSIS — J209 Acute bronchitis, unspecified: Secondary | ICD-10-CM | POA: Diagnosis not present
